# Patient Record
Sex: Female | Born: 1975 | Race: White | Hispanic: No | Marital: Married | Smoking: Never smoker
Health system: Southern US, Community
[De-identification: ages and names within clinical notes are randomized; demographics above are authoritative.]

## PROBLEM LIST (undated history)

## (undated) DIAGNOSIS — A159 Respiratory tuberculosis unspecified: Secondary | ICD-10-CM

## (undated) DIAGNOSIS — D649 Anemia, unspecified: Secondary | ICD-10-CM

## (undated) DIAGNOSIS — R569 Unspecified convulsions: Secondary | ICD-10-CM

## (undated) DIAGNOSIS — F418 Other specified anxiety disorders: Secondary | ICD-10-CM

## (undated) HISTORY — PX: APPENDECTOMY: SHX54

## (undated) HISTORY — PX: ANTERIOR CRUCIATE LIGAMENT REPAIR: SHX115

## (undated) HISTORY — DX: Unspecified convulsions: R56.9

## (undated) HISTORY — DX: Respiratory tuberculosis unspecified: A15.9

## (undated) HISTORY — DX: Other specified anxiety disorders: F41.8

## (undated) HISTORY — DX: Anemia, unspecified: D64.9

---

## 2001-12-15 ENCOUNTER — Other Ambulatory Visit: Admission: RE | Admit: 2001-12-15 | Discharge: 2001-12-15 | Payer: Self-pay | Admitting: Obstetrics & Gynecology

## 2002-06-16 ENCOUNTER — Other Ambulatory Visit: Admission: RE | Admit: 2002-06-16 | Discharge: 2002-06-16 | Payer: Self-pay | Admitting: Obstetrics & Gynecology

## 2003-07-16 ENCOUNTER — Other Ambulatory Visit: Admission: RE | Admit: 2003-07-16 | Discharge: 2003-07-16 | Payer: Self-pay | Admitting: Obstetrics & Gynecology

## 2004-08-23 ENCOUNTER — Other Ambulatory Visit: Admission: RE | Admit: 2004-08-23 | Discharge: 2004-08-23 | Payer: Self-pay | Admitting: Obstetrics & Gynecology

## 2009-09-22 ENCOUNTER — Inpatient Hospital Stay (HOSPITAL_COMMUNITY): Admission: AD | Admit: 2009-09-22 | Discharge: 2009-09-22 | Payer: Self-pay | Admitting: Obstetrics and Gynecology

## 2009-10-26 ENCOUNTER — Ambulatory Visit: Payer: Self-pay | Admitting: Gynecology

## 2009-10-26 ENCOUNTER — Other Ambulatory Visit: Payer: Self-pay | Admitting: Emergency Medicine

## 2009-10-26 ENCOUNTER — Inpatient Hospital Stay (HOSPITAL_COMMUNITY): Admission: AD | Admit: 2009-10-26 | Discharge: 2009-10-26 | Payer: Self-pay | Admitting: Obstetrics and Gynecology

## 2009-11-27 ENCOUNTER — Other Ambulatory Visit: Payer: Self-pay | Admitting: Emergency Medicine

## 2009-11-27 ENCOUNTER — Observation Stay (HOSPITAL_COMMUNITY): Admission: AD | Admit: 2009-11-27 | Discharge: 2009-11-28 | Payer: Self-pay | Admitting: Obstetrics and Gynecology

## 2009-12-20 ENCOUNTER — Inpatient Hospital Stay (HOSPITAL_COMMUNITY)
Admission: AD | Admit: 2009-12-20 | Discharge: 2009-12-23 | Payer: Self-pay | Source: Home / Self Care | Attending: Obstetrics and Gynecology | Admitting: Obstetrics and Gynecology

## 2010-03-28 LAB — CBC
HCT: 27 % — ABNORMAL LOW (ref 36.0–46.0)
HCT: 34.1 % — ABNORMAL LOW (ref 36.0–46.0)
Hemoglobin: 11.6 g/dL — ABNORMAL LOW (ref 12.0–15.0)
Hemoglobin: 9.2 g/dL — ABNORMAL LOW (ref 12.0–15.0)
Platelets: 238 10*3/uL (ref 150–400)
RBC: 3.13 MIL/uL — ABNORMAL LOW (ref 3.87–5.11)
RBC: 4.16 MIL/uL (ref 3.87–5.11)
RBC: 3.96 MIL/uL (ref 3.87–5.11)
RDW: 13.3 % (ref 11.5–15.5)
WBC: 14.5 10*3/uL — ABNORMAL HIGH (ref 4.0–10.5)
WBC: 14.9 10*3/uL — ABNORMAL HIGH (ref 4.0–10.5)
WBC: 9.7 10*3/uL (ref 4.0–10.5)

## 2010-03-28 LAB — DIFFERENTIAL
Basophils Relative: 0 % (ref 0–1)
Eosinophils Absolute: 0.2 10*3/uL (ref 0.0–0.7)
Monocytes Absolute: 0.6 10*3/uL (ref 0.1–1.0)
Monocytes Relative: 6 % (ref 3–12)
Neutrophils Relative %: 77 % (ref 43–77)

## 2010-03-28 LAB — URINALYSIS, ROUTINE W REFLEX MICROSCOPIC
Ketones, ur: NEGATIVE mg/dL
Nitrite: NEGATIVE
Specific Gravity, Urine: 1.025 (ref 1.005–1.030)
pH: 5 (ref 5.0–8.0)

## 2010-03-28 LAB — COMPREHENSIVE METABOLIC PANEL
ALT: 18 U/L (ref 0–35)
Albumin: 2.9 g/dL — ABNORMAL LOW (ref 3.5–5.2)
Alkaline Phosphatase: 131 U/L — ABNORMAL HIGH (ref 39–117)
Glucose, Bld: 103 mg/dL — ABNORMAL HIGH (ref 70–99)
Potassium: 4.2 mEq/L (ref 3.5–5.1)
Sodium: 137 mEq/L (ref 135–145)
Total Protein: 6.1 g/dL (ref 6.0–8.3)

## 2010-03-28 LAB — LAMOTRIGINE LEVEL: Lamotrigine Lvl: 3.2 ug/mL — ABNORMAL LOW (ref 4.0–18.0)

## 2010-03-28 LAB — URINE CULTURE: Culture  Setup Time: 201111131722

## 2010-03-28 LAB — URINE MICROSCOPIC-ADD ON

## 2010-03-28 LAB — RPR: RPR Ser Ql: NONREACTIVE

## 2010-03-30 LAB — URINALYSIS, ROUTINE W REFLEX MICROSCOPIC
Ketones, ur: NEGATIVE mg/dL
Nitrite: NEGATIVE
Specific Gravity, Urine: <1.005 — ABNORMAL LOW (ref 1.005–1.030)
pH: 7 (ref 5.0–8.0)

## 2010-03-30 LAB — COMPREHENSIVE METABOLIC PANEL
Albumin: 3.1 g/dL — ABNORMAL LOW (ref 3.5–5.2)
BUN: 8 mg/dL (ref 6–23)
Creatinine, Ser: 0.52 mg/dL (ref 0.4–1.2)
Total Bilirubin: 0.3 mg/dL (ref 0.3–1.2)
Total Protein: 5.5 g/dL — ABNORMAL LOW (ref 6.0–8.3)

## 2010-03-30 LAB — CBC
MCH: 32 pg (ref 26.0–34.0)
MCV: 91.5 fL (ref 78.0–100.0)
Platelets: 185 10*3/uL (ref 150–400)
RDW: 12.9 % (ref 11.5–15.5)

## 2010-03-30 LAB — URIC ACID: Uric Acid, Serum: 5.5 mg/dL (ref 2.4–7.0)

## 2012-07-26 ENCOUNTER — Encounter: Payer: Self-pay | Admitting: Nurse Practitioner

## 2012-07-28 ENCOUNTER — Telehealth: Payer: Self-pay | Admitting: Neurology

## 2012-07-28 MED ORDER — LAMOTRIGINE ER 200 MG PO TB24: 200.0000 mg | ORAL_TABLET | Freq: Every day | ORAL | Status: DC

## 2012-07-28 NOTE — Telephone Encounter (Signed)
I will sent one refill, since patient has an appt tomorrow.

## 2012-07-29 ENCOUNTER — Ambulatory Visit (INDEPENDENT_AMBULATORY_CARE_PROVIDER_SITE_OTHER): Payer: Commercial Managed Care - PPO | Admitting: Nurse Practitioner

## 2012-07-29 ENCOUNTER — Encounter: Payer: Self-pay | Admitting: Nurse Practitioner

## 2012-07-29 VITALS — BP 110/66 | HR 76 | Ht 65.5 in | Wt 118.0 lb

## 2012-07-29 DIAGNOSIS — G40309 Generalized idiopathic epilepsy and epileptic syndromes, not intractable, without status epilepticus: Secondary | ICD-10-CM | POA: Insufficient documentation

## 2012-07-29 DIAGNOSIS — Z79899 Other long term (current) drug therapy: Secondary | ICD-10-CM

## 2012-07-29 MED ORDER — LAMOTRIGINE ER 200 MG PO TB24: 200.0000 mg | ORAL_TABLET | Freq: Every day | ORAL | Status: DC

## 2012-07-29 NOTE — Progress Notes (Signed)
HPI:  Lori Bender, 37 year old  white female returns for followup. She has a history of generalized epilepsy with her initial  seizure occurring in 2001 after having knee surgery and being on a PCA pump She was then switched to Depakote and was maintained on that drug without difficulty until last seen in February 2010 when she was desiring to become pregnant and was switched to Lamictal without difficulty. She was last seen 08/09/11.  She is taking Lamictal 200 mg  XR  Daily. Denies any side effects to the medication.She has not had further seizure activity.    ROS:  Negative  Physical Exam General: well developed, well nourished, seated, in no evident distress Head: head normocephalic and atraumatic. Oropharynx benign Neck: supple with no carotid  bruits Cardiovascular: regular rate and rhythm, no murmurs  Neurologic Exam Mental Status: Awake and fully alert. Oriented to place and time. Follows all commands. Speech and language normal.   Cranial Nerves: Pupils equal, briskly reactive to light. Extraocular movements full without nystagmus. Visual fields full to confrontation. Hearing intact and symmetric to finger snap. Facial sensation intact. Face, tongue, palate move normally and symmetrically. Neck flexion and extension normal.  Motor: Normal bulk and tone. Normal strength in all tested extremity muscles.No focal weakness Sensory.: intact to touch and pinprick and vibratory.  Coordination: Rapid alternating movements normal in all extremities. Finger-to-nose and heel-to-shin performed accurately bilaterally. Gait and Station: Arises from chair without difficulty. Stance is normal. Gait demonstrates normal stride length and balance . Able to heel, toe and tandem walk without difficulty.  Reflexes: 2+ and symmetric. Toes downgoing.     ASSESSMENT: History of epilepsy with 3 seizures during pregnancy. None since November 2011     PLAN: Patient will continue Lamictal XR 200 mg daily  Labs  were checked with primary care Followup yearly and when necessary  Nilda Riggs, GNP-BC APRN

## 2012-07-29 NOTE — Patient Instructions (Addendum)
Patient will continue Lamictal XR 200 mg daily  Labs were checked with primary care Followup yearly and when necessary

## 2012-08-06 ENCOUNTER — Telehealth: Payer: Self-pay | Admitting: Nurse Practitioner

## 2012-08-06 MED ORDER — LAMOTRIGINE ER 200 MG PO TB24: 200.0000 mg | ORAL_TABLET | Freq: Every day | ORAL | Status: DC

## 2012-08-06 NOTE — Telephone Encounter (Signed)
Rx has been resent 

## 2012-08-06 NOTE — Telephone Encounter (Signed)
I called and spoke with patient to inform her that prescription (Lamotrigine) was faxed on 07-29-12 to [facility], [place] on [address] I informed her that I would let Shanda Bumps know so she can refax prescription.

## 2013-04-22 ENCOUNTER — Telehealth: Payer: Self-pay | Admitting: Neurology

## 2013-04-22 NOTE — Telephone Encounter (Signed)
I do not think it is the Lamictal. Go to the health food store and get a recommendation for a supplement for hair loss

## 2013-04-22 NOTE — Telephone Encounter (Signed)
Patient called to state that for the past 3 months her hair has been falling out a lot and she believes it to be a side effect of Lamictal. Patient is wondering if there is some sort of supplement that she can take in order to prevent her hair from falling out. Please call and advise patient.

## 2013-04-22 NOTE — Telephone Encounter (Signed)
I called the patent back.  Relayed Lori Bender's message.  She will follow this advise and call us back if anything further is needed.

## 2013-04-22 NOTE — Telephone Encounter (Signed)
Patient called the office saying she thinks Lamictal is causing hair loss.  By looking back at her chart notes, we have been prescribing Lamictal since 2010.  I called the patient back.  She said she feels like her hair has been thinning more over the past 3 months and thinks it could be caused by taking Lamictal.  I asked her if anything else has changed in past few months that may contribute to this condition and she said no.  Says she wants to continue Lamictal, but would like to know if Hoyle Sauer thinks this could be a continuous problem while on this medication and if she can recommend something safe to take with it that will promote hair growth.  Says she currently takes a glucosamine supplement which has calcium in it.  Please advise.  Thank you.

## 2013-06-07 ENCOUNTER — Other Ambulatory Visit: Payer: Self-pay | Admitting: Neurology

## 2013-07-29 ENCOUNTER — Ambulatory Visit: Payer: Commercial Managed Care - PPO | Admitting: Nurse Practitioner

## 2013-08-29 ENCOUNTER — Ambulatory Visit (INDEPENDENT_AMBULATORY_CARE_PROVIDER_SITE_OTHER): Payer: Commercial Managed Care - PPO | Admitting: Family Medicine

## 2013-08-29 ENCOUNTER — Telehealth: Payer: Self-pay

## 2013-08-29 ENCOUNTER — Telehealth: Payer: Self-pay | Admitting: Family Medicine

## 2013-08-29 VITALS — BP 100/80 | HR 90 | Temp 97.9°F | Resp 16 | Ht 65.5 in | Wt 122.0 lb

## 2013-08-29 DIAGNOSIS — H6500 Acute serous otitis media, unspecified ear: Secondary | ICD-10-CM

## 2013-08-29 DIAGNOSIS — H6504 Acute serous otitis media, recurrent, right ear: Secondary | ICD-10-CM

## 2013-08-29 MED ORDER — PSEUDOEPHEDRINE HCL ER 120 MG PO TB12: 120.0000 mg | ORAL_TABLET | Freq: Two times a day (BID) | ORAL | Status: DC

## 2013-08-29 MED ORDER — FLUTICASONE PROPIONATE 50 MCG/ACT NA SUSP: 2.0000 | Freq: Every day | NASAL | Status: DC

## 2013-08-29 MED ORDER — CEFDINIR 300 MG PO CAPS: 300.0000 mg | ORAL_CAPSULE | Freq: Two times a day (BID) | ORAL | Status: DC

## 2013-08-29 NOTE — Telephone Encounter (Signed)
ERROR

## 2013-08-29 NOTE — Patient Instructions (Signed)
Serous Otitis Media °Serous otitis media is fluid in the middle ear space. This space contains the bones for hearing and air. Air in the middle ear space helps to transmit sound.  °The air gets there through the eustachian tube. This tube goes from the back of the nose (nasopharynx) to the middle ear space. It keeps the pressure in the middle ear the same as the outside world. It also helps to drain fluid from the middle ear space. °CAUSES  °Serous otitis media occurs when the eustachian tube gets blocked. Blockage can come from: °· Ear infections. °· Colds and other upper respiratory infections. °· Allergies. °· Irritants such as cigarette smoke. °· Sudden changes in air pressure (such as descending in an airplane). °· Enlarged adenoids. °· A mass in the nasopharynx. °During colds and upper respiratory infections, the middle ear space can become temporarily filled with fluid. This can happen after an ear infection also. Once the infection clears, the fluid will generally drain out of the ear through the eustachian tube. If it does not, then serous otitis media occurs. °SIGNS AND SYMPTOMS  °· Hearing loss. °· A feeling of fullness in the ear, without pain. °· Young children may not show any symptoms but may show slight behavioral changes, such as agitation, ear pulling, or crying. °DIAGNOSIS  °Serous otitis media is diagnosed by an ear exam. Tests may be done to check on the movement of the eardrum. Hearing exams may also be done. °TREATMENT  °The fluid most often goes away without treatment. If allergy is the cause, allergy treatment may be helpful. Fluid that persists for several months may require minor surgery. A small tube is placed in the eardrum to: °· Drain the fluid. °· Restore the air in the middle ear space. °In certain situations, antibiotic medicines are used to avoid surgery. Surgery may be done to remove enlarged adenoids (if this is the cause). °HOME CARE INSTRUCTIONS  °· Keep children away from  tobacco smoke. °· Keep all follow-up visits as directed by your health care provider. °SEEK MEDICAL CARE IF:  °· Your hearing is not better in 3 months. °· Your hearing is worse. °· You have ear pain. °· You have drainage from the ear. °· You have dizziness. °· You have serous otitis media only in one ear or have any bleeding from your nose (epistaxis). °· You notice a lump on your neck. °MAKE SURE YOU: °· Understand these instructions.   °· Will watch your condition.   °· Will get help right away if you are not doing well or get worse.   °Document Released: 03/24/2003 Document Revised: 05/18/2013 Document Reviewed: 07/29/2012 °ExitCare® Patient Information ©2015 [employer]. This information is not intended to replace advice given to you by your health care provider. Make sure you discuss any questions you have with your health care provider. ° °

## 2013-08-29 NOTE — Telephone Encounter (Signed)
The patient called for clarification regarding medication she was prescribed during her visit on 08/29/13 with Dr. Brigitte Pulse.  She wanted to know how long she needed to take the medications--pseudoephedrine (SUDAFED 12 HOUR) 120 MG 12 hr tablet, fluticasone (FLONASE) 50 MCG/ACT nasal spray, and cefdinir (OMNICEF) 300 MG capsule.  CB#:[phone]

## 2013-08-30 NOTE — Telephone Encounter (Signed)
Advised pt per Dr. Berneta Levins weeks, Sudafed- 1 week, and antibiotic for 10 days

## 2013-09-01 NOTE — Progress Notes (Signed)
Subjective:    Patient ID: Lori Bender, female    DOB: [birth year], 38 y.o.   MRN: [id number] Chief Complaint  Patient presents with  . Ear Problem    pt states for a few days she has been having right ear problems;  she states it feels stopped up; no fever, chills or cold sxs; pt has hx of ear infections    HPI  Past Medical History  Diagnosis Date  . Depression with anxiety   . Seizures    Current Outpatient Prescriptions on File Prior to Visit  Medication Sig Dispense Refill  . LamoTRIgine XR 200 MG TB24 TAKE 1 TABLET BY MOUTH EVERY DAY  30 tablet  2   No current facility-administered medications on file prior to visit.   Allergies  Allergen Reactions  . Amoxicillin Hives     Review of Systems  Constitutional: Negative for fever, chills, diaphoresis, activity change, appetite change and fatigue.  HENT: Positive for congestion, ear pain and rhinorrhea. Negative for ear discharge, nosebleeds, postnasal drip, sinus pressure, sneezing, sore throat, trouble swallowing and voice change.   Eyes: Negative for discharge and itching.  Respiratory: Negative for cough and shortness of breath.   Cardiovascular: Negative for chest pain.  Gastrointestinal: Negative for nausea, vomiting and abdominal pain.  Musculoskeletal: Negative for neck pain and neck stiffness.  Skin: Negative for rash.  Neurological: Positive for headaches. Negative for dizziness and syncope.  Hematological: Negative for adenopathy.  Psychiatric/Behavioral: Negative for sleep disturbance.       Objective:  BP 100/80  Pulse 90  Temp(Src) 97.9 F (36.6 C) (Oral)  Resp 16  Ht 5' 5.5" (1.664 m)  Wt 122 lb (55.339 kg)  BMI 19.99 kg/m2  SpO2 100%  LMP 08/03/2013  Physical Exam  Constitutional: She is oriented to person, place, and time. She appears well-developed and well-nourished. She does not appear ill. No distress.  HENT:  Head: Normocephalic and atraumatic.  Right Ear: External ear and ear  canal normal. Tympanic membrane is retracted. A middle ear effusion is present.  Left Ear: External ear and ear canal normal. Tympanic membrane is retracted. A middle ear effusion is present.  Nose: Mucosal edema and rhinorrhea present. Right sinus exhibits no maxillary sinus tenderness and no frontal sinus tenderness. Left sinus exhibits no maxillary sinus tenderness and no frontal sinus tenderness.  Mouth/Throat: Uvula is midline and mucous membranes are normal. No oropharyngeal exudate, posterior oropharyngeal edema, posterior oropharyngeal erythema or tonsillar abscesses.  Eyes: Conjunctivae are normal. Right eye exhibits no discharge. Left eye exhibits no discharge. No scleral icterus.  Neck: Normal range of motion. Neck supple.  Cardiovascular: Normal rate, regular rhythm, normal heart sounds and intact distal pulses.   Pulmonary/Chest: Effort normal and breath sounds normal.  Lymphadenopathy:       Head (right side): No submandibular, no preauricular and no posterior auricular adenopathy present.       Head (left side): No submandibular, no preauricular and no posterior auricular adenopathy present.    She has no cervical adenopathy.       Right: No supraclavicular adenopathy present.       Left: No supraclavicular adenopathy present.  Neurological: She is alert and oriented to person, place, and time.  Skin: Skin is warm and dry. She is not diaphoretic. No erythema.  Psychiatric: She has a normal mood and affect. Her behavior is normal.          Assessment & Plan:   Recurrent acute serous otitis  media of right ear Given SNAP rx for omnicef to fill in case sxs worsen w/ f/c/worsening localized pain  Meds ordered this encounter  Medications  . BIOTIN PO    Sig: Take by mouth.  . pseudoephedrine (SUDAFED 12 HOUR) 120 MG 12 hr tablet    Sig: Take 1 tablet (120 mg total) by mouth 2 (two) times daily.    Dispense:  30 tablet    Refill:  0  . fluticasone (FLONASE) 50 MCG/ACT  nasal spray    Sig: Place 2 sprays into both nostrils at bedtime.    Dispense:  16 g    Refill:  2  . cefdinir (OMNICEF) 300 MG capsule    Sig: Take 1 capsule (300 mg total) by mouth 2 (two) times daily.    Dispense:  20 capsule    Refill:  0    Delman Cheadle, MD MPH

## 2016-07-11 DIAGNOSIS — Z01419 Encounter for gynecological examination (general) (routine) without abnormal findings: Secondary | ICD-10-CM | POA: Diagnosis not present

## 2016-07-16 ENCOUNTER — Other Ambulatory Visit: Payer: Self-pay | Admitting: Obstetrics & Gynecology

## 2016-07-16 DIAGNOSIS — N6310 Unspecified lump in the right breast, unspecified quadrant: Secondary | ICD-10-CM

## 2016-07-20 ENCOUNTER — Ambulatory Visit
Admission: RE | Admit: 2016-07-20 | Discharge: 2016-07-20 | Disposition: A | Discharge status: Home / Self Care | Payer: Commercial Managed Care - PPO | Source: Ambulatory Visit | Attending: Obstetrics & Gynecology | Admitting: Obstetrics & Gynecology

## 2016-07-20 ENCOUNTER — Other Ambulatory Visit: Payer: Self-pay | Admitting: Obstetrics & Gynecology

## 2016-07-20 DIAGNOSIS — R922 Inconclusive mammogram: Secondary | ICD-10-CM | POA: Diagnosis not present

## 2016-07-20 DIAGNOSIS — N6313 Unspecified lump in the right breast, lower outer quadrant: Secondary | ICD-10-CM | POA: Diagnosis not present

## 2016-07-20 DIAGNOSIS — N6310 Unspecified lump in the right breast, unspecified quadrant: Secondary | ICD-10-CM

## 2016-07-20 DIAGNOSIS — N6311 Unspecified lump in the right breast, upper outer quadrant: Secondary | ICD-10-CM | POA: Diagnosis not present

## 2016-07-20 DIAGNOSIS — D241 Benign neoplasm of right breast: Secondary | ICD-10-CM

## 2016-08-25 NOTE — Progress Notes (Addendum)
[place] at [facility] [address], [place], [place] [address] [phone]  Date:  08/27/2016   Name:  Lori Bender   DOB:  1975/02/09   MRN:  [id number]  PCP:  Darreld Mclean, MD    Chief Complaint: Establish Care   History of Present Illness:  Lori Bender is a 41 y.o. very pleasant female patient who presents with the following:  Here today as a new patient to establish care History of epilepsy- her neurologist is Dr. Verdene Rio at [facility].  Per his most recent note in Epic from 09/2015:  Reason For Visit Follow-up (sz Friday (first in 6 yrs) ) and Follow-up (increased stress / lack of sleep )  Chief Complaint The patient returns for evaluation of a breakthrough seizure.  History of Present Illness The patient up until 3 days ago had been 6 years seizure-free on lamotrigine 200 mg daily. On Thursday and Friday last week she has missed considerable amount of sleep. She was going on a trip has been busy working on this. She had gone to bed late for 2 nights in a row and had gotten up quite early on the morning of the seizure. She had a witnessed generalized seizure. Her lamictal blood level was 5 (lower limits of therapeutic). She has a known very abnormal EEG with prominent epileptogenic activity. She uses alcohol very modestly. She does not have a febrile illness. There has been no recent change in medication. She has been taking her medications on a regular basis with no missed doses.  Here today with her 15 yo son.    She had a seizure back in 2017- they changed her medication dosage.   She has done fine since then  She is otherwise generally in good health  Her first grand-mal seizure was in 2000, but she had absence seizures since HS  History of appendectomy and ACL repair Never a smoker, drinks socially  She sees GYN- Dr. 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 Generalized convulsive epilepsy without mention of intractable epilepsy 07/29/2012  . Encounter for long-term (current) use of other medications 07/29/2012    Past Medical History:  Diagnosis Date  . Anemia   . Depression with anxiety   . Seizures ([place])   . Tuberculosis    Exposur     Past Surgical History:  Procedure Laterality Date  . ANTERIOR CRUCIATE LIGAMENT REPAIR    . APPENDECTOMY      Social History  Substance Use Topics  . Smoking status: Never Smoker  . Smokeless tobacco: Never Used  . Alcohol use Yes     Comment: rare 4 drinks a month     Family History  Problem Relation Age of Onset  . Diabetes Mother   . Cancer Father     Allergies  Allergen Reactions  . Amoxicillin  Hives    Medication list has been reviewed and updated.  Current Outpatient Prescriptions on File Prior to Visit  Medication Sig Dispense Refill  . BIOTIN PO Take by mouth.    . LamoTRIgine XR 200 MG TB24 TAKE 1 TABLET BY MOUTH EVERY DAY 30 tablet 2   No current facility-administered medications on file prior to visit.     Review of Systems:  As per HPI- otherwise negative. She never has any CP or SOB with exercise.  Can feel tired at times No fever, chills, rash, cough or ST    Physical Examination: Vitals:   08/27/16 1506  BP: 125/86  Pulse: 83  Temp: 98.2 F (36.8 C)  SpO2: 99%   Vitals:   08/27/16 1506  Weight: 121 lb 12.8 oz (55.2 kg)  Height: 5' 5.5" (1.664 m)   Body mass index is 19.96 kg/m. Ideal Body Weight: Weight in (lb) to  have BMI = 25: 152.2  GEN: WDWN, NAD, Non-toxic, A & O x 3, slender, looks well HEENT: Atraumatic, Normocephalic. Neck supple. No masses, No LAD. Bilateral TM wnl, oropharynx normal.  PEERL,EOMI.  Ears and Nose: No external deformity. CV: RRR, No M/G/R. No JVD. No thrill. No extra heart sounds. PULM: CTA B, no wheezes, crackles, rhonchi. No retractions. No resp. distress. No accessory muscle use. ABD: S, NT, ND, +BS. No rebound. No HSM. EXTR: No c/c/e NEURO Normal gait.  PSYCH: Normally interactive. Conversant. Not depressed or anxious appearing.  Calm demeanor.    Assessment and Plan: Physical exam  Fatigue, unspecified type - Plan: TSH  Screening for hyperlipidemia - Plan: Lipid panel  Screening for diabetes mellitus - Plan: Comprehensive metabolic panel, Hemoglobin A1c  History of anemia - Plan: CBC  Here today for a CPE Labs pending as above Will be in touch with her with results Her seizure disorder is managed by neurology   Signed Lamar Blinks, MD  Received her labs and sent letter to pt on 8/14  Results for orders placed or performed in visit on 08/27/16  CBC  Result Value Ref Range   WBC 7.0 4.0 - 10.5 K/uL   RBC 4.85 3.87 - 5.11 Mil/uL   Platelets 249.0 150.0 - 400.0 K/uL   Hemoglobin 14.4 12.0 - 15.0 g/dL   HCT 44.1 36.0 - 46.0 %   MCV 91.0 78.0 - 100.0 fl   MCHC 32.7 30.0 - 36.0 g/dL   RDW 12.9 11.5 - 15.5 %  Comprehensive metabolic panel  Result Value Ref Range   Sodium 140 135 - 145 mEq/L   Potassium 4.4 3.5 - 5.1 mEq/L   Chloride 104 96 - 112 mEq/L   CO2 30 19 - 32 mEq/L   Glucose, Bld 89 70 - 99 mg/dL   BUN 18 6 - 23 mg/dL   Creatinine, Ser 0.81 0.40 - 1.20 mg/dL   Total Bilirubin 0.4 0.2 - 1.2 mg/dL   Alkaline Phosphatase 76 39 - 117 U/L   AST 23 0 - 37 U/L   ALT 27 0 - 35 U/L   Total Protein 6.5 6.0 - 8.3 g/dL   Albumin 4.7 3.5 - 5.2 g/dL   Calcium 9.6 8.4 - 10.5 mg/dL   GFR 83.28 >60.00 mL/min  Lipid panel  Result Value Ref Range    Cholesterol 167 0 - 200 mg/dL   Triglycerides 78.0 0.0 - 149.0 mg/dL   HDL 44.20 >39.00 mg/dL   VLDL 15.6 0.0 - 40.0 mg/dL   LDL Cholesterol 107 (H) 0 -  99 mg/dL   Total CHOL/HDL Ratio 4    NonHDL 122.93   TSH  Result Value Ref Range   TSH 3.77 0.35 - 4.50 uIU/mL  Hemoglobin A1c  Result Value Ref Range   Hgb A1c MFr Bld 5.4 4.6 - 6.5 %

## 2016-08-27 ENCOUNTER — Encounter: Payer: Self-pay | Admitting: Family Medicine

## 2016-08-27 ENCOUNTER — Ambulatory Visit (INDEPENDENT_AMBULATORY_CARE_PROVIDER_SITE_OTHER): Payer: Commercial Managed Care - PPO | Admitting: Family Medicine

## 2016-08-27 VITALS — BP 125/86 | HR 83 | Temp 98.2°F | Ht 65.5 in | Wt 121.8 lb

## 2016-08-27 DIAGNOSIS — Z13220 Encounter for screening for lipoid disorders: Secondary | ICD-10-CM

## 2016-08-27 DIAGNOSIS — Z131 Encounter for screening for diabetes mellitus: Secondary | ICD-10-CM

## 2016-08-27 DIAGNOSIS — Z0000 Encounter for general adult medical examination without abnormal findings: Secondary | ICD-10-CM | POA: Diagnosis not present

## 2016-08-27 DIAGNOSIS — R5383 Other fatigue: Secondary | ICD-10-CM | POA: Diagnosis not present

## 2016-08-27 DIAGNOSIS — Z862 Personal history of diseases of the blood and blood-forming organs and certain disorders involving the immune mechanism: Secondary | ICD-10-CM

## 2016-08-27 NOTE — Patient Instructions (Signed)
It was very nice to see you today!  Take care and I will be in touch with your labs asap  Continue to be active and take good care of yourself.   Health Maintenance, Female Adopting a healthy lifestyle and getting preventive care can go a long way to promote health and wellness. Talk with your health care provider about what schedule of regular examinations is right for you. This is a good chance for you to check in with your provider about disease prevention and staying healthy. In between checkups, there are plenty of things you can do on your own. Experts have done a lot of research about which lifestyle changes and preventive measures are most likely to keep you healthy. Ask your health care provider for more information. Weight and diet Eat a healthy diet  Be sure to include plenty of vegetables, fruits, low-fat dairy products, and lean protein.  Do not eat a lot of foods high in solid fats, added sugars, or salt.  Get regular exercise. This is one of the most important things you can do for your health. ? Most adults should exercise for at least 150 minutes each week. The exercise should increase your heart rate and make you sweat (moderate-intensity exercise). ? Most adults should also do strengthening exercises at least twice a week. This is in addition to the moderate-intensity exercise.  Maintain a healthy weight  Body mass index (BMI) is a measurement that can be used to identify possible weight problems. It estimates body fat based on height and weight. Your health care provider can help determine your BMI and help you achieve or maintain a healthy weight.  For females 71 years of age and older: ? A BMI below 18.5 is considered underweight. ? A BMI of 18.5 to 24.9 is normal. ? A BMI of 25 to 29.9 is considered overweight. ? A BMI of 30 and above is considered obese.  Watch levels of cholesterol and blood lipids  You should start having your blood tested for lipids and  cholesterol at 40 years of age, then have this test every 5 years.  You may need to have your cholesterol levels checked more often if: ? Your lipid or cholesterol levels are high. ? You are older than 40 years of age. ? You are at high risk for heart disease.  Cancer screening Lung Cancer  Lung cancer screening is recommended for adults 52-19 years old who are at high risk for lung cancer because of a history of smoking.  A yearly low-dose CT scan of the lungs is recommended for people who: ? Currently smoke. ? Have quit within the past 15 years. ? Have at least a 30-pack-year history of smoking. A pack year is smoking an average of one pack of cigarettes a day for 1 year.  Yearly screening should continue until it has been 15 years since you quit.  Yearly screening should stop if you develop a health problem that would prevent you from having lung cancer treatment.  Breast Cancer  Practice breast self-awareness. This means understanding how your breasts normally appear and feel.  It also means doing regular breast self-exams. Let your health care provider know about any changes, no matter how small.  If you are in your 20s or 30s, you should have a clinical breast exam (CBE) by a health care provider every 1-3 years as part of a regular health exam.  If you are 51 or older, have a CBE every year. Also  consider having a breast X-ray (mammogram) every year.  If you have a family history of breast cancer, talk to your health care provider about genetic screening.  If you are at high risk for breast cancer, talk to your health care provider about having an MRI and a mammogram every year.  Breast cancer gene (BRCA) assessment is recommended for women who have family members with BRCA-related cancers. BRCA-related cancers include: ? Breast. ? Ovarian. ? Tubal. ? Peritoneal cancers.  Results of the assessment will determine the need for genetic counseling and BRCA1 and BRCA2  testing.  Cervical Cancer Your health care provider may recommend that you be screened regularly for cancer of the pelvic organs (ovaries, uterus, and vagina). This screening involves a pelvic examination, including checking for microscopic changes to the surface of your cervix (Pap test). You may be encouraged to have this screening done every 3 years, beginning at age 47.  For women ages 60-65, health care providers may recommend pelvic exams and Pap testing every 3 years, or they may recommend the Pap and pelvic exam, combined with testing for human papilloma virus (HPV), every 5 years. Some types of HPV increase your risk of cervical cancer. Testing for HPV may also be done on women of any age with unclear Pap test results.  Other health care providers may not recommend any screening for nonpregnant women who are considered low risk for pelvic cancer and who do not have symptoms. Ask your health care provider if a screening pelvic exam is right for you.  If you have had past treatment for cervical cancer or a condition that could lead to cancer, you need Pap tests and screening for cancer for at least 20 years after your treatment. If Pap tests have been discontinued, your risk factors (such as having a new sexual partner) need to be reassessed to determine if screening should resume. Some women have medical problems that increase the chance of getting cervical cancer. In these cases, your health care provider may recommend more frequent screening and Pap tests.  Colorectal Cancer  This type of cancer can be detected and often prevented.  Routine colorectal cancer screening usually begins at 40 years of age and continues through 40 years of age.  Your health care provider may recommend screening at an earlier age if you have risk factors for colon cancer.  Your health care provider may also recommend using home test kits to check for hidden blood in the stool.  A small camera at the end of a  tube can be used to examine your colon directly (sigmoidoscopy or colonoscopy). This is done to check for the earliest forms of colorectal cancer.  Routine screening usually begins at age 61.  Direct examination of the colon should be repeated every 5-10 years through 40 years of age. However, you may need to be screened more often if early forms of precancerous polyps or small growths are found.  Skin Cancer  Check your skin from head to toe regularly.  Tell your health care provider about any new moles or changes in moles, especially if there is a change in a mole's shape or color.  Also tell your health care provider if you have a mole that is larger than the size of a pencil eraser.  Always use sunscreen. Apply sunscreen liberally and repeatedly throughout the day.  Protect yourself by wearing long sleeves, pants, a wide-brimmed hat, and sunglasses whenever you are outside.  Heart disease, diabetes, and high blood  pressure  High blood pressure causes heart disease and increases the risk of stroke. High blood pressure is more likely to develop in: ? People who have blood pressure in the high end of the normal range (130-139/85-89 mm Hg). ? People who are overweight or obese. ? People who are African American.  If you are 34-17 years of age, have your blood pressure checked every 3-5 years. If you are 79 years of age or older, have your blood pressure checked every year. You should have your blood pressure measured twice-once when you are at a hospital or clinic, and once when you are not at a hospital or clinic. Record the average of the two measurements. To check your blood pressure when you are not at a hospital or clinic, you can use: ? An automated blood pressure machine at a pharmacy. ? A home blood pressure monitor.  If you are between 15 years and 57 years old, ask your health care provider if you should take aspirin to prevent strokes.  Have regular diabetes screenings. This  involves taking a blood sample to check your fasting blood sugar level. ? If you are at a normal weight and have a low risk for diabetes, have this test once every three years after 40 years of age. ? If you are overweight and have a high risk for diabetes, consider being tested at a younger age or more often. Preventing infection Hepatitis B  If you have a higher risk for hepatitis B, you should be screened for this virus. You are considered at high risk for hepatitis B if: ? You were born in a country where hepatitis B is common. Ask your health care provider which countries are considered high risk. ? Your parents were born in a high-risk country, and you have not been immunized against hepatitis B (hepatitis B vaccine). ? You have HIV or AIDS. ? You use needles to inject street drugs. ? You live with someone who has hepatitis B. ? You have had sex with someone who has hepatitis B. ? You get hemodialysis treatment. ? You take certain medicines for conditions, including cancer, organ transplantation, and autoimmune conditions.  Hepatitis C  Blood testing is recommended for: ? Everyone born from [birth year] through 1964. ? Anyone with known risk factors for hepatitis C.  Sexually transmitted infections (STIs)  You should be screened for sexually transmitted infections (STIs) including gonorrhea and chlamydia if: ? You are sexually active and are younger than 40 years of age. ? You are older than 40 years of age and your health care provider tells you that you are at risk for this type of infection. ? Your sexual activity has changed since you were last screened and you are at an increased risk for chlamydia or gonorrhea. Ask your health care provider if you are at risk.  If you do not have HIV, but are at risk, it may be recommended that you take a prescription medicine daily to prevent HIV infection. This is called pre-exposure prophylaxis (PrEP). You are considered at risk if: ? You are  sexually active and do not regularly use condoms or know the HIV status of your partner(s). ? You take drugs by injection. ? You are sexually active with a partner who has HIV.  Talk with your health care provider about whether you are at high risk of being infected with HIV. If you choose to begin PrEP, you should first be tested for HIV. You should then be tested every  3 months for as long as you are taking PrEP. Pregnancy  If you are premenopausal and you may become pregnant, ask your health care provider about preconception counseling.  If you may become pregnant, take 400 to 800 micrograms (mcg) of folic acid every day.  If you want to prevent pregnancy, talk to your health care provider about birth control (contraception). Osteoporosis and menopause  Osteoporosis is a disease in which the bones lose minerals and strength with aging. This can result in serious bone fractures. Your risk for osteoporosis can be identified using a bone density scan.  If you are 16 years of age or older, or if you are at risk for osteoporosis and fractures, ask your health care provider if you should be screened.  Ask your health care provider whether you should take a calcium or vitamin D supplement to lower your risk for osteoporosis.  Menopause may have certain physical symptoms and risks.  Hormone replacement therapy may reduce some of these symptoms and risks. Talk to your health care provider about whether hormone replacement therapy is right for you. Follow these instructions at home:  Schedule regular health, dental, and eye exams.  Stay current with your immunizations.  Do not use any tobacco products including cigarettes, chewing tobacco, or electronic cigarettes.  If you are pregnant, do not drink alcohol.  If you are breastfeeding, limit how much and how often you drink alcohol.  Limit alcohol intake to no more than 1 drink per day for nonpregnant women. One drink equals 12 ounces of  beer, 5 ounces of wine, or 1 ounces of hard liquor.  Do not use street drugs.  Do not share needles.  Ask your health care provider for help if you need support or information about quitting drugs.  Tell your health care provider if you often feel depressed.  Tell your health care provider if you have ever been abused or do not feel safe at home. This information is not intended to replace advice given to you by your health care provider. Make sure you discuss any questions you have with your health care provider. Document Released: 07/17/2010 Document Revised: 06/09/2015 Document Reviewed: 10/05/2014 Elsevier Interactive Patient Education  [employer].

## 2016-08-28 LAB — COMPREHENSIVE METABOLIC PANEL
ALK PHOS: 76 U/L (ref 39–117)
ALT: 27 U/L (ref 0–35)
AST: 23 U/L (ref 0–37)
Albumin: 4.7 g/dL (ref 3.5–5.2)
BUN: 18 mg/dL (ref 6–23)
CHLORIDE: 104 meq/L (ref 96–112)
CO2: 30 mEq/L (ref 19–32)
Calcium: 9.6 mg/dL (ref 8.4–10.5)
Creatinine, Ser: 0.81 mg/dL (ref 0.40–1.20)
GFR: 83.28 mL/min (ref >60.00)
GLUCOSE: 89 mg/dL (ref 70–99)
POTASSIUM: 4.4 meq/L (ref 3.5–5.1)
Sodium: 140 mEq/L (ref 135–145)
TOTAL PROTEIN: 6.5 g/dL (ref 6.0–8.3)
Total Bilirubin: 0.4 mg/dL (ref 0.2–1.2)

## 2016-08-28 LAB — LIPID PANEL
CHOLESTEROL: 167 mg/dL (ref 0–200)
HDL: 44.2 mg/dL (ref >39.00)
LDL Cholesterol: 107 mg/dL — ABNORMAL HIGH (ref 0–99)
NONHDL: 122.93
Total CHOL/HDL Ratio: 4
Triglycerides: 78 mg/dL (ref 0.0–149.0)
VLDL: 15.6 mg/dL (ref 0.0–40.0)

## 2016-08-28 LAB — CBC
HEMATOCRIT: 44.1 % (ref 36.0–46.0)
Hemoglobin: 14.4 g/dL (ref 12.0–15.0)
MCHC: 32.7 g/dL (ref 30.0–36.0)
MCV: 91 fl (ref 78.0–100.0)
Platelets: 249 10*3/uL (ref 150.0–400.0)
RBC: 4.85 Mil/uL (ref 3.87–5.11)
RDW: 12.9 % (ref 11.5–15.5)
WBC: 7 10*3/uL (ref 4.0–10.5)

## 2016-08-28 LAB — HEMOGLOBIN A1C: Hgb A1c MFr Bld: 5.4 % (ref 4.6–6.5)

## 2016-08-28 LAB — TSH: TSH: 3.77 u[IU]/mL (ref 0.35–4.50)

## 2016-12-03 DIAGNOSIS — M9903 Segmental and somatic dysfunction of lumbar region: Secondary | ICD-10-CM | POA: Diagnosis not present

## 2016-12-03 DIAGNOSIS — M47816 Spondylosis without myelopathy or radiculopathy, lumbar region: Secondary | ICD-10-CM | POA: Diagnosis not present

## 2016-12-04 DIAGNOSIS — M47816 Spondylosis without myelopathy or radiculopathy, lumbar region: Secondary | ICD-10-CM | POA: Diagnosis not present

## 2016-12-04 DIAGNOSIS — M9903 Segmental and somatic dysfunction of lumbar region: Secondary | ICD-10-CM | POA: Diagnosis not present

## 2016-12-10 DIAGNOSIS — M47816 Spondylosis without myelopathy or radiculopathy, lumbar region: Secondary | ICD-10-CM | POA: Diagnosis not present

## 2016-12-10 DIAGNOSIS — M9903 Segmental and somatic dysfunction of lumbar region: Secondary | ICD-10-CM | POA: Diagnosis not present

## 2016-12-12 DIAGNOSIS — M47816 Spondylosis without myelopathy or radiculopathy, lumbar region: Secondary | ICD-10-CM | POA: Diagnosis not present

## 2016-12-12 DIAGNOSIS — M9903 Segmental and somatic dysfunction of lumbar region: Secondary | ICD-10-CM | POA: Diagnosis not present

## 2016-12-18 DIAGNOSIS — M47816 Spondylosis without myelopathy or radiculopathy, lumbar region: Secondary | ICD-10-CM | POA: Diagnosis not present

## 2016-12-18 DIAGNOSIS — M9903 Segmental and somatic dysfunction of lumbar region: Secondary | ICD-10-CM | POA: Diagnosis not present

## 2016-12-19 DIAGNOSIS — M47816 Spondylosis without myelopathy or radiculopathy, lumbar region: Secondary | ICD-10-CM | POA: Diagnosis not present

## 2016-12-19 DIAGNOSIS — M9903 Segmental and somatic dysfunction of lumbar region: Secondary | ICD-10-CM | POA: Diagnosis not present

## 2017-01-17 DIAGNOSIS — M9903 Segmental and somatic dysfunction of lumbar region: Secondary | ICD-10-CM | POA: Diagnosis not present

## 2017-01-17 DIAGNOSIS — M47816 Spondylosis without myelopathy or radiculopathy, lumbar region: Secondary | ICD-10-CM | POA: Diagnosis not present

## 2017-01-21 ENCOUNTER — Other Ambulatory Visit: Payer: Commercial Managed Care - PPO

## 2017-01-24 ENCOUNTER — Other Ambulatory Visit: Payer: Self-pay | Admitting: Obstetrics & Gynecology

## 2017-01-24 ENCOUNTER — Ambulatory Visit
Admission: RE | Admit: 2017-01-24 | Discharge: 2017-01-24 | Disposition: A | Discharge status: Home / Self Care | Payer: Commercial Managed Care - PPO | Source: Ambulatory Visit | Attending: Obstetrics & Gynecology | Admitting: Obstetrics & Gynecology

## 2017-01-24 DIAGNOSIS — D241 Benign neoplasm of right breast: Secondary | ICD-10-CM

## 2017-01-24 DIAGNOSIS — N6311 Unspecified lump in the right breast, upper outer quadrant: Secondary | ICD-10-CM | POA: Diagnosis not present

## 2017-01-24 DIAGNOSIS — N6313 Unspecified lump in the right breast, lower outer quadrant: Secondary | ICD-10-CM | POA: Diagnosis not present

## 2017-01-24 DIAGNOSIS — H5213 Myopia, bilateral: Secondary | ICD-10-CM | POA: Diagnosis not present

## 2017-07-25 ENCOUNTER — Ambulatory Visit
Admission: RE | Admit: 2017-07-25 | Discharge: 2017-07-25 | Disposition: A | Discharge status: Home / Self Care | Payer: Commercial Managed Care - PPO | Source: Ambulatory Visit | Attending: Obstetrics & Gynecology | Admitting: Obstetrics & Gynecology

## 2017-07-25 DIAGNOSIS — R922 Inconclusive mammogram: Secondary | ICD-10-CM | POA: Diagnosis not present

## 2017-07-25 DIAGNOSIS — N6310 Unspecified lump in the right breast, unspecified quadrant: Secondary | ICD-10-CM | POA: Diagnosis not present

## 2017-07-25 DIAGNOSIS — Z6820 Body mass index (BMI) 20.0-20.9, adult: Secondary | ICD-10-CM | POA: Diagnosis not present

## 2017-07-25 DIAGNOSIS — D241 Benign neoplasm of right breast: Secondary | ICD-10-CM

## 2017-07-25 DIAGNOSIS — Z01419 Encounter for gynecological examination (general) (routine) without abnormal findings: Secondary | ICD-10-CM | POA: Diagnosis not present

## 2017-10-24 DIAGNOSIS — G40409 Other generalized epilepsy and epileptic syndromes, not intractable, without status epilepticus: Secondary | ICD-10-CM | POA: Diagnosis not present

## 2018-03-20 DIAGNOSIS — H5213 Myopia, bilateral: Secondary | ICD-10-CM | POA: Diagnosis not present

## 2018-04-09 IMAGING — MG 2D DIGITAL DIAGNOSTIC BILATERAL MAMMOGRAM WITH CAD AND ADJUNCT T
8 of 15 series · 8 of 35 positions shown · non-contrast
Comparison: May 20, 2012

CLINICAL DATA: 40-year-old patient presents for evaluation of a
palpable lump in the outer right breast.

EXAM:
2D DIGITAL DIAGNOSTIC BILATERAL MAMMOGRAM WITH CAD AND ADJUNCT TOMO
ULTRASOUND RIGHT BREAST

[R CC synth-2D]
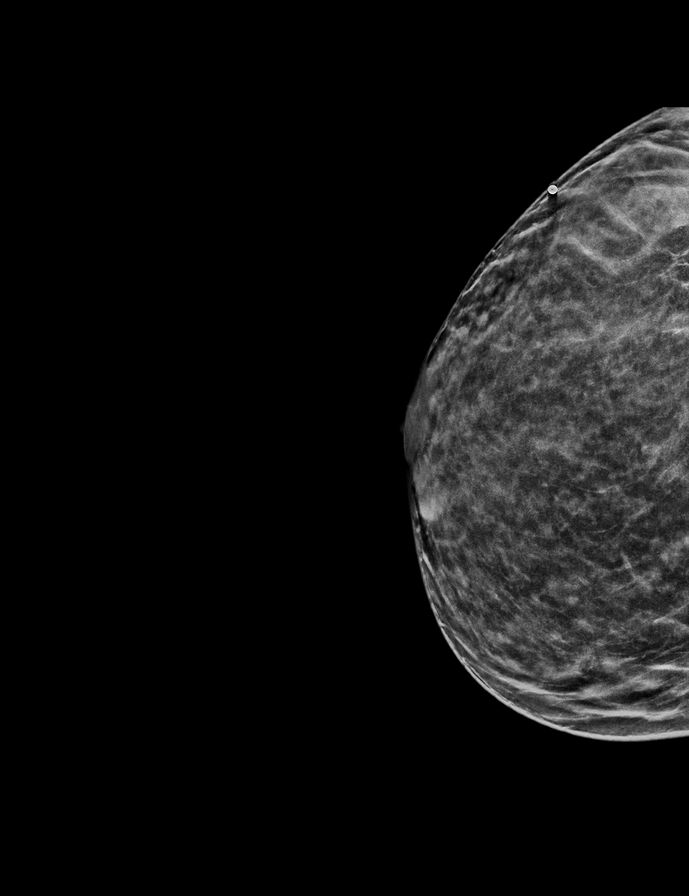

[R TAN synth-2D]
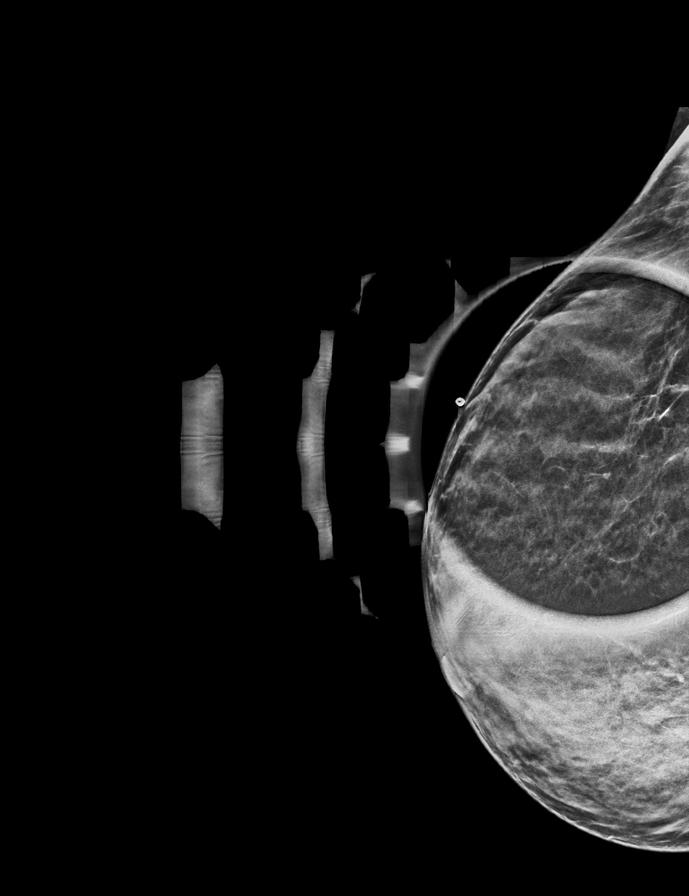

[R CC]
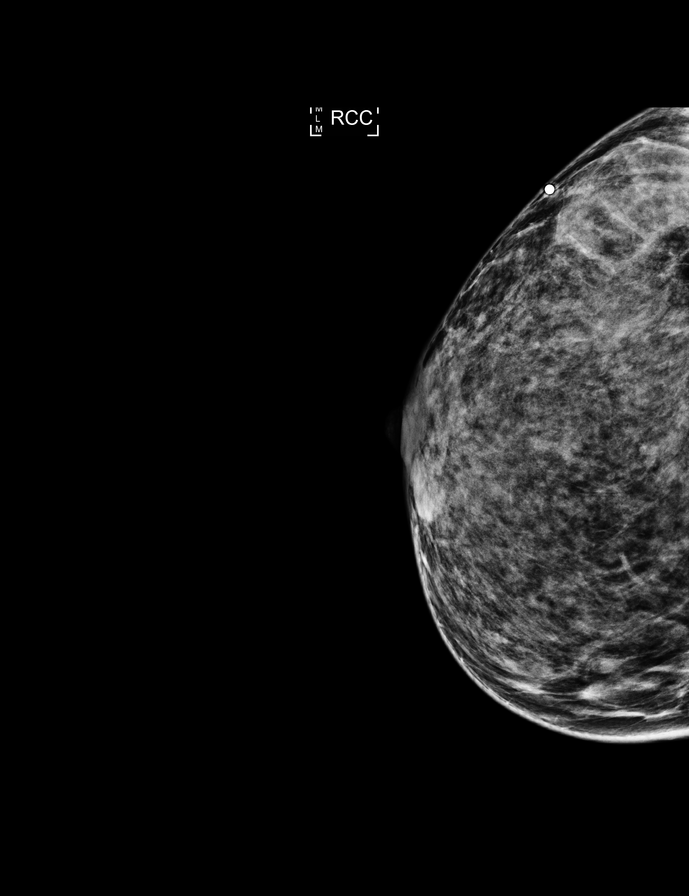

[L CC synth-2D]
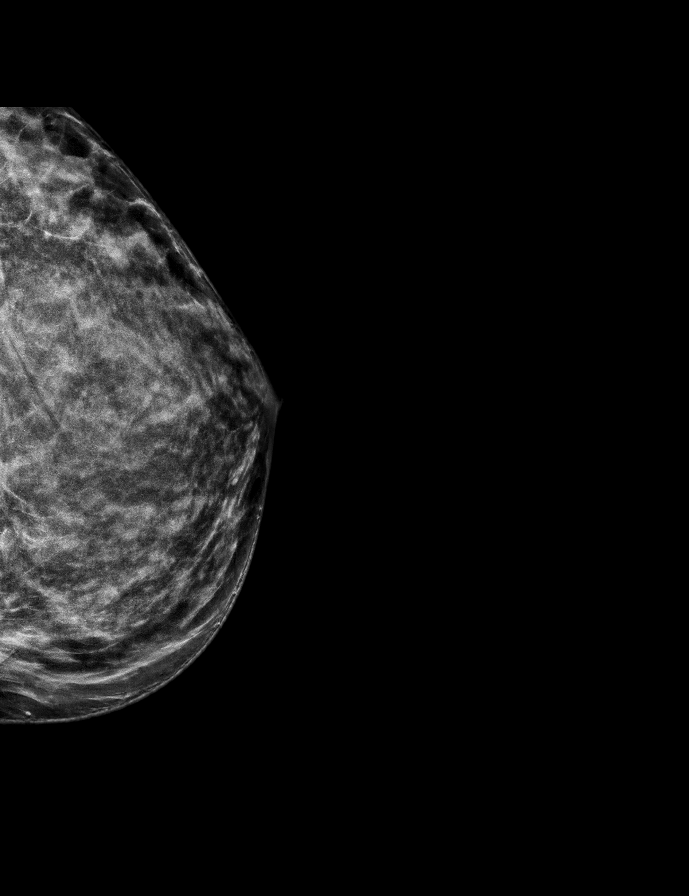

[L MLO]
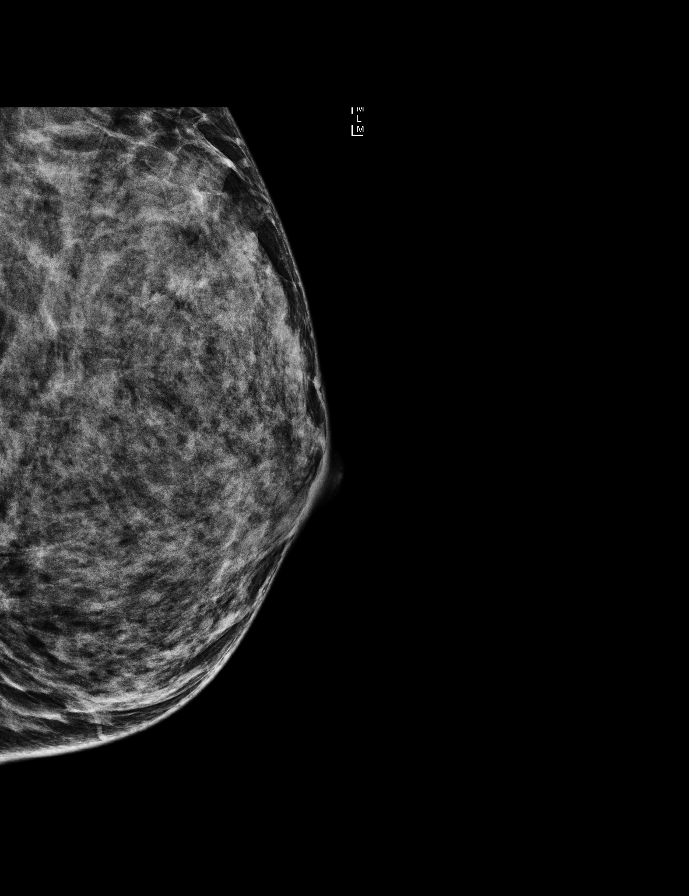

[R MLO]
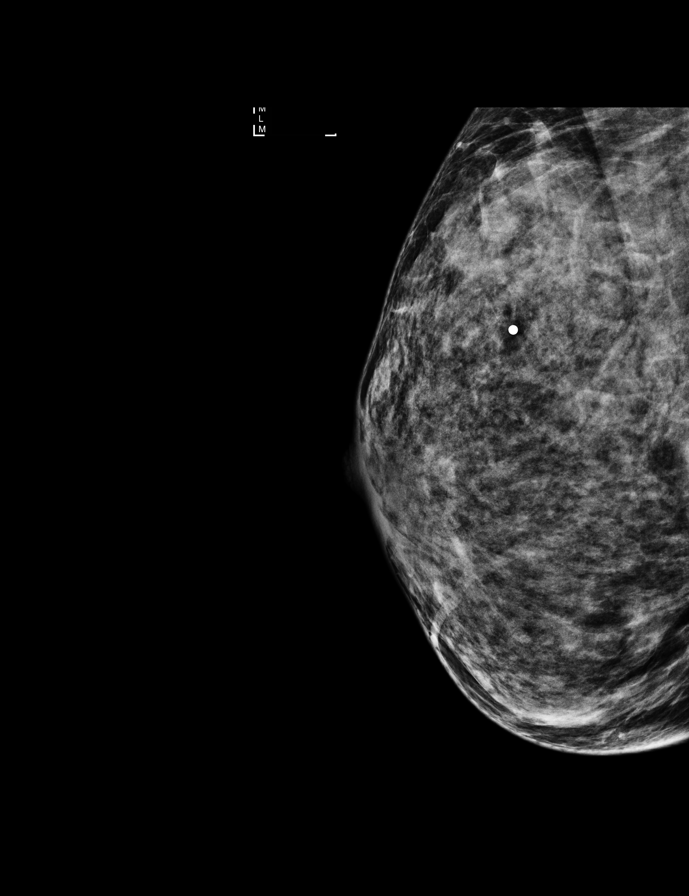

[R MLO synth-2D]
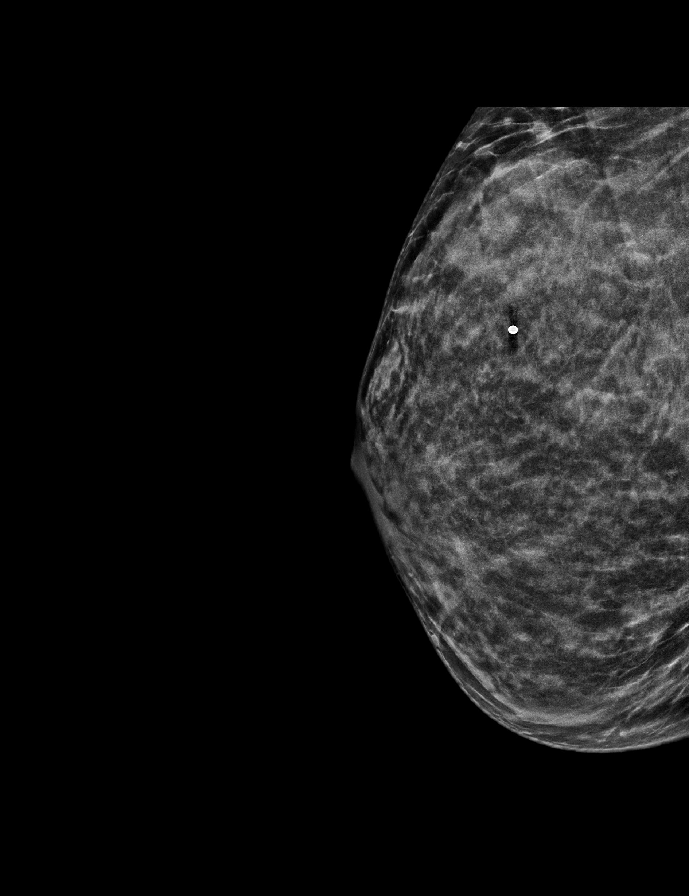

[L CC]
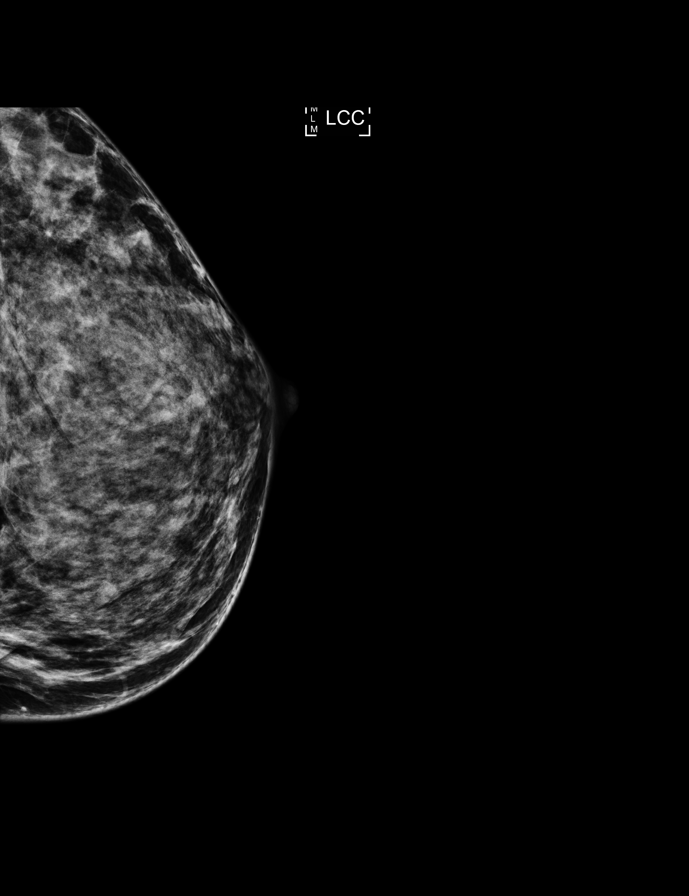

[8 of 35 positions shown; findings below may reference images not displayed]

ACR Breast Density Category d: The breast tissue is extremely dense,
which lowers the sensitivity of mammography.
FINDINGS: Metallic skin marker was placed in the region of palpable concern in
the outer right breast. On a focal spot compression view with
tomography, an oval partially obscured mass is identified deep to
the metallic skin marker. No additional mass is identified in either
breast. No suspicious microcalcification or architectural distortion
is seen in either breast.

Mammographic images were processed with CAD.

On physical exam, there is a an easily palpable smooth mobile 2 cm
mass in the 9 o'clock position of the right breast 6 cm from the
nipple.

Targeted ultrasound is performed, showing a homogeneously hypoechoic
bilobed mass measuring 2.2 x 1.9 x 1.0 cm at 9 o'clock position 6 cm
from the nipple. There are 2 similar-appearing but much smaller
masses in the [DATE] position of the right breast 5 cm from the
nipple. One of these measures 0.7 x 0.3 x 0.6 cm and the other
measures 0.8 x 0.7 x 0.4 cm.
IMPRESSION: Three probable fibroadenomas in the [DATE] to [DATE] region of the right
breast. The largest of these corresponds to the palpable lump.

No evidence of malignancy in the left breast.

Extremely dense breast parenchyma bilaterally.

RECOMMENDATION:
The options of follow-up right breast ultrasounds at the time of the
patient's exams in 6, 12, and 24 months versus ultrasound-guided
biopsy, versus surgical excision were discussed with the patient.
She opts for six-month follow-up right breast ultrasound.

Right breast ultrasound is recommended in 6 months.

I have discussed the findings and recommendations with the patient.
Results were also provided in writing at the conclusion of the
visit. If applicable, a reminder letter will be sent to the patient
regarding the next appointment.

BI-RADS CATEGORY  3: Probably benign.

## 2018-06-11 ENCOUNTER — Other Ambulatory Visit: Payer: Self-pay | Admitting: Obstetrics & Gynecology

## 2018-06-11 DIAGNOSIS — N6310 Unspecified lump in the right breast, unspecified quadrant: Secondary | ICD-10-CM

## 2018-07-31 ENCOUNTER — Ambulatory Visit
Admission: RE | Admit: 2018-07-31 | Discharge: 2018-07-31 | Disposition: A | Discharge status: Home / Self Care | Payer: Commercial Managed Care - PPO | Source: Ambulatory Visit | Attending: Obstetrics & Gynecology | Admitting: Obstetrics & Gynecology

## 2018-07-31 ENCOUNTER — Other Ambulatory Visit: Payer: Self-pay

## 2018-07-31 DIAGNOSIS — N6310 Unspecified lump in the right breast, unspecified quadrant: Secondary | ICD-10-CM

## 2019-07-02 ENCOUNTER — Other Ambulatory Visit: Payer: Self-pay | Admitting: Obstetrics & Gynecology

## 2019-07-02 DIAGNOSIS — Z1231 Encounter for screening mammogram for malignant neoplasm of breast: Secondary | ICD-10-CM

## 2019-08-10 ENCOUNTER — Other Ambulatory Visit: Payer: Self-pay | Admitting: Family Medicine

## 2019-08-10 ENCOUNTER — Ambulatory Visit: Payer: Commercial Managed Care - PPO

## 2019-08-10 DIAGNOSIS — Z1231 Encounter for screening mammogram for malignant neoplasm of breast: Secondary | ICD-10-CM

## 2019-09-08 ENCOUNTER — Ambulatory Visit
Admission: RE | Admit: 2019-09-08 | Discharge: 2019-09-08 | Disposition: A | Discharge status: Home / Self Care | Payer: Commercial Managed Care - PPO | Source: Ambulatory Visit | Attending: Family Medicine | Admitting: Family Medicine

## 2019-09-08 ENCOUNTER — Other Ambulatory Visit: Payer: Self-pay

## 2019-09-08 DIAGNOSIS — Z1231 Encounter for screening mammogram for malignant neoplasm of breast: Secondary | ICD-10-CM

## 2019-11-05 ENCOUNTER — Encounter: Payer: Self-pay | Admitting: Family Medicine

## 2020-08-08 ENCOUNTER — Other Ambulatory Visit: Payer: Self-pay | Admitting: Family Medicine

## 2020-08-08 DIAGNOSIS — Z1231 Encounter for screening mammogram for malignant neoplasm of breast: Secondary | ICD-10-CM

## 2020-09-29 ENCOUNTER — Other Ambulatory Visit: Payer: Self-pay

## 2020-09-29 ENCOUNTER — Ambulatory Visit
Admission: RE | Admit: 2020-09-29 | Discharge: 2020-09-29 | Disposition: A | Discharge status: Home / Self Care | Payer: Commercial Managed Care - PPO | Source: Ambulatory Visit | Attending: Family Medicine | Admitting: Family Medicine

## 2020-09-29 DIAGNOSIS — Z1231 Encounter for screening mammogram for malignant neoplasm of breast: Secondary | ICD-10-CM

## 2020-10-06 ENCOUNTER — Other Ambulatory Visit: Payer: Self-pay | Admitting: Family Medicine

## 2020-10-06 DIAGNOSIS — R928 Other abnormal and inconclusive findings on diagnostic imaging of breast: Secondary | ICD-10-CM

## 2020-10-21 ENCOUNTER — Ambulatory Visit
Admission: RE | Admit: 2020-10-21 | Discharge: 2020-10-21 | Disposition: A | Discharge status: Home / Self Care | Payer: Commercial Managed Care - PPO | Source: Ambulatory Visit | Attending: Family Medicine | Admitting: Family Medicine

## 2020-10-21 ENCOUNTER — Other Ambulatory Visit: Payer: Self-pay

## 2020-10-21 DIAGNOSIS — R928 Other abnormal and inconclusive findings on diagnostic imaging of breast: Secondary | ICD-10-CM

## 2021-09-25 ENCOUNTER — Other Ambulatory Visit: Payer: Self-pay | Admitting: Family Medicine

## 2021-09-25 DIAGNOSIS — Z1231 Encounter for screening mammogram for malignant neoplasm of breast: Secondary | ICD-10-CM

## 2021-10-23 ENCOUNTER — Ambulatory Visit
Admission: RE | Admit: 2021-10-23 | Discharge: 2021-10-23 | Disposition: A | Discharge status: Home / Self Care | Payer: Commercial Managed Care - PPO | Source: Ambulatory Visit | Attending: Family Medicine | Admitting: Family Medicine

## 2021-10-23 DIAGNOSIS — Z1231 Encounter for screening mammogram for malignant neoplasm of breast: Secondary | ICD-10-CM

## 2022-09-10 ENCOUNTER — Other Ambulatory Visit: Payer: Self-pay | Admitting: Obstetrics and Gynecology

## 2022-09-10 ENCOUNTER — Other Ambulatory Visit: Payer: Self-pay | Admitting: Family Medicine

## 2022-09-10 DIAGNOSIS — Z1231 Encounter for screening mammogram for malignant neoplasm of breast: Secondary | ICD-10-CM

## 2022-10-26 ENCOUNTER — Ambulatory Visit
Admission: RE | Admit: 2022-10-26 | Discharge: 2022-10-26 | Disposition: A | Discharge status: Home / Self Care | Payer: Commercial Managed Care - PPO | Source: Ambulatory Visit | Attending: Obstetrics and Gynecology | Admitting: Obstetrics and Gynecology

## 2022-10-26 DIAGNOSIS — Z1231 Encounter for screening mammogram for malignant neoplasm of breast: Secondary | ICD-10-CM

## 2023-09-25 ENCOUNTER — Other Ambulatory Visit: Payer: Self-pay | Admitting: Obstetrics and Gynecology

## 2023-09-25 DIAGNOSIS — Z1231 Encounter for screening mammogram for malignant neoplasm of breast: Secondary | ICD-10-CM

## 2023-10-28 ENCOUNTER — Ambulatory Visit
Admission: RE | Admit: 2023-10-28 | Discharge: 2023-10-28 | Disposition: A | Source: Ambulatory Visit | Attending: Obstetrics and Gynecology

## 2023-10-28 DIAGNOSIS — Z1231 Encounter for screening mammogram for malignant neoplasm of breast: Secondary | ICD-10-CM
